# Patient Record
Sex: Female | Born: 1960 | Race: White | Hispanic: No | Marital: Married | Smoking: Former smoker
Health system: Southern US, Community
[De-identification: ages and names within clinical notes are randomized; demographics above are authoritative.]

## PROBLEM LIST (undated history)

## (undated) HISTORY — PX: PAROTIDECTOMY: SUR1003

---

## 2009-01-29 DIAGNOSIS — C801 Malignant (primary) neoplasm, unspecified: Secondary | ICD-10-CM

## 2009-01-29 HISTORY — DX: Malignant (primary) neoplasm, unspecified: C80.1

## 2009-11-29 ENCOUNTER — Ambulatory Visit: Payer: Self-pay | Admitting: Otolaryngology

## 2009-12-21 ENCOUNTER — Ambulatory Visit: Payer: Self-pay | Admitting: Otolaryngology

## 2009-12-29 ENCOUNTER — Ambulatory Visit: Payer: Self-pay | Admitting: Radiation Oncology

## 2010-01-06 ENCOUNTER — Ambulatory Visit: Payer: Self-pay | Admitting: Otolaryngology

## 2010-01-09 ENCOUNTER — Ambulatory Visit: Payer: Self-pay | Admitting: Radiation Oncology

## 2010-01-29 ENCOUNTER — Ambulatory Visit: Payer: Self-pay | Admitting: Radiation Oncology

## 2010-03-01 ENCOUNTER — Ambulatory Visit: Payer: Self-pay | Admitting: Radiation Oncology

## 2010-03-30 ENCOUNTER — Ambulatory Visit: Payer: Self-pay | Admitting: Radiation Oncology

## 2010-04-30 ENCOUNTER — Ambulatory Visit: Payer: Self-pay | Admitting: Radiation Oncology

## 2010-07-19 ENCOUNTER — Ambulatory Visit: Payer: Self-pay | Admitting: Oncology

## 2010-07-30 ENCOUNTER — Ambulatory Visit: Payer: Self-pay | Admitting: Oncology

## 2010-10-09 ENCOUNTER — Ambulatory Visit: Payer: Self-pay | Admitting: Oncology

## 2010-10-30 ENCOUNTER — Ambulatory Visit: Payer: Self-pay | Admitting: Oncology

## 2010-12-27 ENCOUNTER — Ambulatory Visit: Payer: Self-pay | Admitting: Oncology

## 2010-12-30 ENCOUNTER — Ambulatory Visit: Payer: Self-pay | Admitting: Oncology

## 2012-01-07 ENCOUNTER — Ambulatory Visit: Payer: Self-pay | Admitting: Radiation Oncology

## 2012-01-30 ENCOUNTER — Ambulatory Visit: Payer: Self-pay | Admitting: Radiation Oncology

## 2012-02-07 ENCOUNTER — Ambulatory Visit: Payer: Self-pay | Admitting: Otolaryngology

## 2012-07-07 ENCOUNTER — Ambulatory Visit: Payer: Self-pay | Admitting: Radiation Oncology

## 2012-07-29 ENCOUNTER — Ambulatory Visit: Payer: Self-pay | Admitting: Radiation Oncology

## 2019-02-09 ENCOUNTER — Other Ambulatory Visit: Payer: Self-pay | Admitting: Orthopedic Surgery

## 2019-02-09 DIAGNOSIS — M4722 Other spondylosis with radiculopathy, cervical region: Secondary | ICD-10-CM

## 2019-02-15 ENCOUNTER — Ambulatory Visit
Admission: RE | Admit: 2019-02-15 | Discharge: 2019-02-15 | Disposition: A | Discharge status: Home / Self Care | Payer: BC Managed Care – PPO | Source: Ambulatory Visit | Attending: Orthopedic Surgery | Admitting: Orthopedic Surgery

## 2019-02-15 ENCOUNTER — Other Ambulatory Visit: Payer: Self-pay

## 2019-02-15 DIAGNOSIS — M4722 Other spondylosis with radiculopathy, cervical region: Secondary | ICD-10-CM

## 2019-02-16 ENCOUNTER — Ambulatory Visit: Admission: RE | Admit: 2019-02-16 | Payer: Self-pay | Source: Ambulatory Visit

## 2019-03-03 ENCOUNTER — Other Ambulatory Visit: Payer: Self-pay | Admitting: Neurosurgery

## 2019-03-27 ENCOUNTER — Other Ambulatory Visit: Payer: Self-pay

## 2019-03-27 ENCOUNTER — Encounter
Admission: RE | Admit: 2019-03-27 | Discharge: 2019-03-27 | Disposition: A | Discharge status: Home / Self Care | Payer: BC Managed Care – PPO | Source: Ambulatory Visit | Attending: Neurosurgery | Admitting: Neurosurgery

## 2019-03-27 NOTE — Patient Instructions (Signed)
Your COVID test is scheduled on:  Monday 04/06/19.  Drive up in front of the [facility] any time 8:00-10:30 am.  If you are required to get additional lab work, you may be required to enter through the [facility].  Your procedure is scheduled on: Wednesday 04/08/19 Report to Same Day Surgery 2nd floor [facility] Entrance-take elevator on left to 2nd floor.  Check in with surgery information desk.) To find out your arrival time, call [phone] 1:00-3:00 PM on Tuesday 04/07/19  Remember: Instructions that are not followed completely may result in serious medical risk, up to and including death, or upon the discretion of your surgeon and anesthesiologist your surgery may need to be rescheduled.   __x__ 1. Do not eat food (including mints, candies, chewing gum) after midnight the night before your procedure. You may drink clear liquids up to 2 hours before you are scheduled to arrive at the hospital for your procedure.  Do not drink anything within 2 hours of your scheduled arrival to the hospital.  Approved clear liquids:  --Water or Apple juice without pulp  --Clear carbohydrate beverage such as Gatorade or Powerade  --Black Coffee or Clear Tea (No milk, no creamers, do not add anything to the coffee or tea)   __x__ 2. No alcohol, smoking or e-cigarettes for 24 hours before or after surgery.  __x__ 3. Notify your doctor if there is any change in your medical condition (cold, fever, infections).  __x__ 4. On the morning of surgery brush your teeth with toothpaste and water.  You may rinse your mouth with mouthwash if you wish.  Do not swallow any toothpaste or mouthwash.  Please read over the following fact sheets that you were given: [facility] Preparing for Surgery, MRSA Information, Spine Surgery Guide, Advance Directives  __x__ Use CHG Soap as directed on instruction sheet.  Do not wear jewelry, make-up, hairpins, clips or nail polish on the day of surgery. Do  not wear lotions, powders, deodorant, or perfumes.  Do not shave below the face/neck 48 hours prior to surgery.  Do not bring valuables to the hospital.  [facility] is not responsible for any belongings or valuables.   Dentures or bridgework may not be worn into surgery. For patients admitted to the hospital, discharge time is determined by your treatment team.  __x__ Take these medicines on the morning of surgery with a SMALL SIP OF WATER:  1. Gabapentin (Neurontin)  __x__ Follow recommendations from Cardiologist, Pulmonologist or PCP regarding stopping Aspirin, Coumadin, Plavix, Eliquis, Effient, Pradaxa, and Pletal, if you are on any blood thinners.  __x__ Do not take any Anti-inflammatories such as Advil, Ibuprofen, Motrin, Aleve, Naproxen, Naprosyn, BC/Goodies powders or aspirin-containing products. You may take Tylenol if needed.   __x__ Do not take any over the counter vitamins/ supplements until after surgery.

## 2019-04-06 ENCOUNTER — Other Ambulatory Visit: Payer: Self-pay

## 2019-04-06 ENCOUNTER — Encounter
Admission: RE | Admit: 2019-04-06 | Discharge: 2019-04-06 | Disposition: A | Discharge status: Home / Self Care | Payer: BC Managed Care – PPO | Source: Ambulatory Visit | Attending: Neurosurgery | Admitting: Neurosurgery

## 2019-04-06 ENCOUNTER — Other Ambulatory Visit: Payer: BC Managed Care – PPO

## 2019-04-06 DIAGNOSIS — Z20822 Contact with and (suspected) exposure to covid-19: Secondary | ICD-10-CM | POA: Insufficient documentation

## 2019-04-06 DIAGNOSIS — Z01812 Encounter for preprocedural laboratory examination: Secondary | ICD-10-CM | POA: Insufficient documentation

## 2019-04-06 LAB — BASIC METABOLIC PANEL
Anion gap: 8 (ref 5–15)
BUN: 13 mg/dL (ref 6–20)
CO2: 25 mmol/L (ref 22–32)
Calcium: 9.3 mg/dL (ref 8.9–10.3)
Chloride: 108 mmol/L (ref 98–111)
Creatinine, Ser: 0.61 mg/dL (ref 0.44–1.00)
GFR calc Af Amer: >60 mL/min (ref >60)
GFR calc non Af Amer: >60 mL/min (ref >60)
Glucose, Bld: 103 mg/dL — ABNORMAL HIGH (ref 70–99)
Potassium: 3.8 mmol/L (ref 3.5–5.1)
Sodium: 141 mmol/L (ref 135–145)

## 2019-04-06 LAB — URINALYSIS, ROUTINE W REFLEX MICROSCOPIC
Bilirubin Urine: NEGATIVE
Glucose, UA: NEGATIVE mg/dL
Hgb urine dipstick: NEGATIVE
Ketones, ur: NEGATIVE mg/dL
Leukocytes,Ua: NEGATIVE
Nitrite: NEGATIVE
Protein, ur: NEGATIVE mg/dL
Specific Gravity, Urine: 1.01 (ref 1.005–1.030)
pH: 5 (ref 5.0–8.0)

## 2019-04-06 LAB — SURGICAL PCR SCREEN
MRSA, PCR: NEGATIVE
Staphylococcus aureus: POSITIVE — AB

## 2019-04-06 LAB — SARS CORONAVIRUS 2 (TAT 6-24 HRS): SARS Coronavirus 2: NEGATIVE

## 2019-04-06 LAB — PROTIME-INR
INR: 0.9 (ref 0.8–1.2)
Prothrombin Time: 12.5 seconds (ref 11.4–15.2)

## 2019-04-06 LAB — TYPE AND SCREEN
ABO/RH(D): A POS
Antibody Screen: NEGATIVE

## 2019-04-06 LAB — CBC
HCT: 40.5 % (ref 36.0–46.0)
Hemoglobin: 13.1 g/dL (ref 12.0–15.0)
MCH: 27.7 pg (ref 26.0–34.0)
MCHC: 32.3 g/dL (ref 30.0–36.0)
MCV: 85.6 fL (ref 80.0–100.0)
Platelets: 272 10*3/uL (ref 150–400)
RBC: 4.73 MIL/uL (ref 3.87–5.11)
RDW: 12.7 % (ref 11.5–15.5)
WBC: 4.7 10*3/uL (ref 4.0–10.5)
nRBC: 0 % (ref 0.0–0.2)

## 2019-04-06 LAB — APTT: aPTT: 29 seconds (ref 24–36)

## 2019-04-08 ENCOUNTER — Inpatient Hospital Stay
Admission: RE | Admit: 2019-04-08 | Discharge: 2019-04-09 | DRG: 473 | Disposition: A | Discharge status: Home / Self Care | Payer: BC Managed Care – PPO | Attending: Neurosurgery | Admitting: Neurosurgery

## 2019-04-08 ENCOUNTER — Inpatient Hospital Stay: Payer: BC Managed Care – PPO | Admitting: Registered Nurse

## 2019-04-08 ENCOUNTER — Encounter: Payer: Self-pay | Admitting: Neurosurgery

## 2019-04-08 ENCOUNTER — Other Ambulatory Visit: Payer: Self-pay

## 2019-04-08 ENCOUNTER — Encounter
Admission: RE | Disposition: A | Discharge status: Home / Self Care | Payer: Self-pay | Source: Home / Self Care | Attending: Neurosurgery

## 2019-04-08 ENCOUNTER — Inpatient Hospital Stay: Payer: BC Managed Care – PPO

## 2019-04-08 DIAGNOSIS — Z79899 Other long term (current) drug therapy: Secondary | ICD-10-CM

## 2019-04-08 DIAGNOSIS — F17290 Nicotine dependence, other tobacco product, uncomplicated: Secondary | ICD-10-CM | POA: Diagnosis present

## 2019-04-08 DIAGNOSIS — M2578 Osteophyte, vertebrae: Secondary | ICD-10-CM | POA: Diagnosis present

## 2019-04-08 DIAGNOSIS — E669 Obesity, unspecified: Secondary | ICD-10-CM | POA: Diagnosis present

## 2019-04-08 DIAGNOSIS — Z886 Allergy status to analgesic agent status: Secondary | ICD-10-CM | POA: Diagnosis not present

## 2019-04-08 DIAGNOSIS — Z20822 Contact with and (suspected) exposure to covid-19: Secondary | ICD-10-CM | POA: Diagnosis present

## 2019-04-08 DIAGNOSIS — M4712 Other spondylosis with myelopathy, cervical region: Secondary | ICD-10-CM | POA: Diagnosis present

## 2019-04-08 DIAGNOSIS — Z419 Encounter for procedure for purposes other than remedying health state, unspecified: Secondary | ICD-10-CM

## 2019-04-08 DIAGNOSIS — Z888 Allergy status to other drugs, medicaments and biological substances status: Secondary | ICD-10-CM

## 2019-04-08 DIAGNOSIS — M4802 Spinal stenosis, cervical region: Secondary | ICD-10-CM | POA: Diagnosis present

## 2019-04-08 DIAGNOSIS — Z981 Arthrodesis status: Secondary | ICD-10-CM

## 2019-04-08 DIAGNOSIS — Z6830 Body mass index (BMI) 30.0-30.9, adult: Secondary | ICD-10-CM | POA: Diagnosis not present

## 2019-04-08 DIAGNOSIS — G959 Disease of spinal cord, unspecified: Secondary | ICD-10-CM | POA: Diagnosis present

## 2019-04-08 HISTORY — PX: ANTERIOR CERVICAL DECOMP/DISCECTOMY FUSION: SHX1161

## 2019-04-08 LAB — ABO/RH: ABO/RH(D): A POS

## 2019-04-08 SURGERY — ANTERIOR CERVICAL DECOMPRESSION/DISCECTOMY FUSION 3 LEVELS
Anesthesia: General

## 2019-04-08 MED ORDER — PROPOFOL 10 MG/ML IV BOLUS
INTRAVENOUS | Status: AC
  Filled 2019-04-08: qty 20

## 2019-04-08 MED ORDER — KETAMINE HCL 10 MG/ML IJ SOLN
INTRAMUSCULAR | Status: DC | PRN
  Administered 2019-04-08 (×2): 20 mg via INTRAVENOUS
  Administered 2019-04-08: 10 mg via INTRAVENOUS

## 2019-04-08 MED ORDER — DEXAMETHASONE SODIUM PHOSPHATE 10 MG/ML IJ SOLN
INTRAMUSCULAR | Status: DC | PRN
  Administered 2019-04-08: 10 mg via INTRAVENOUS

## 2019-04-08 MED ORDER — LATANOPROST 0.005 % OP SOLN
1.0000 [drp] | OPHTHALMIC | Status: DC
  Filled 2019-04-08: qty 2.5

## 2019-04-08 MED ORDER — BACITRACIN 50000 UNITS IM SOLR
INTRAMUSCULAR | Status: AC
  Filled 2019-04-08: qty 1

## 2019-04-08 MED ORDER — DEXAMETHASONE SODIUM PHOSPHATE 10 MG/ML IJ SOLN
8.0000 mg | Freq: Four times a day (QID) | INTRAMUSCULAR | Status: AC
  Administered 2019-04-08 – 2019-04-09 (×3): 8 mg via INTRAVENOUS
  Filled 2019-04-08 (×3): qty 0.8

## 2019-04-08 MED ORDER — DEXAMETHASONE SODIUM PHOSPHATE 10 MG/ML IJ SOLN
INTRAMUSCULAR | Status: AC
  Filled 2019-04-08: qty 1

## 2019-04-08 MED ORDER — ACETAMINOPHEN 650 MG RE SUPP: 650.0000 mg | RECTAL | Status: DC | PRN

## 2019-04-08 MED ORDER — GLYCOPYRROLATE 0.2 MG/ML IJ SOLN
INTRAMUSCULAR | Status: DC | PRN
  Administered 2019-04-08: .2 mg via INTRAVENOUS

## 2019-04-08 MED ORDER — REMIFENTANIL HCL 1 MG IV SOLR
INTRAVENOUS | Status: AC
  Filled 2019-04-08: qty 1000

## 2019-04-08 MED ORDER — POLYETHYLENE GLYCOL 3350 17 G PO PACK: 17.0000 g | PACK | Freq: Every day | ORAL | Status: DC | PRN

## 2019-04-08 MED ORDER — OXYCODONE HCL 5 MG PO TABS: 5.0000 mg | ORAL_TABLET | Freq: Once | ORAL | Status: DC | PRN

## 2019-04-08 MED ORDER — METHOCARBAMOL 500 MG PO TABS
500.0000 mg | ORAL_TABLET | Freq: Four times a day (QID) | ORAL | Status: DC
  Administered 2019-04-08 – 2019-04-09 (×3): 500 mg via ORAL
  Filled 2019-04-08 (×3): qty 1

## 2019-04-08 MED ORDER — SODIUM CHLORIDE 0.9% FLUSH
3.0000 mL | Freq: Two times a day (BID) | INTRAVENOUS | Status: DC
  Administered 2019-04-08 – 2019-04-09 (×3): 3 mL via INTRAVENOUS

## 2019-04-08 MED ORDER — CEFAZOLIN SODIUM-DEXTROSE 2-4 GM/100ML-% IV SOLN
2.0000 g | Freq: Once | INTRAVENOUS | Status: AC
  Administered 2019-04-08: 2 g via INTRAVENOUS

## 2019-04-08 MED ORDER — FENTANYL CITRATE (PF) 100 MCG/2ML IJ SOLN
INTRAMUSCULAR | Status: AC
  Administered 2019-04-08: 25 ug via INTRAVENOUS
  Filled 2019-04-08: qty 2

## 2019-04-08 MED ORDER — GABAPENTIN 300 MG PO CAPS
300.0000 mg | ORAL_CAPSULE | Freq: Three times a day (TID) | ORAL | Status: DC
  Administered 2019-04-08 – 2019-04-09 (×3): 300 mg via ORAL
  Filled 2019-04-08 (×3): qty 1

## 2019-04-08 MED ORDER — DEXMEDETOMIDINE HCL 200 MCG/2ML IV SOLN
INTRAVENOUS | Status: DC | PRN
  Administered 2019-04-08 (×2): 8 ug via INTRAVENOUS
  Administered 2019-04-08: 4 ug via INTRAVENOUS
  Administered 2019-04-08: 8 ug via INTRAVENOUS

## 2019-04-08 MED ORDER — SODIUM CHLORIDE 0.9% FLUSH: 3.0000 mL | INTRAVENOUS | Status: DC | PRN

## 2019-04-08 MED ORDER — EPHEDRINE 5 MG/ML INJ
INTRAVENOUS | Status: AC
  Filled 2019-04-08: qty 30

## 2019-04-08 MED ORDER — SODIUM CHLORIDE 0.9 % IV SOLN: INTRAVENOUS | Status: DC

## 2019-04-08 MED ORDER — SODIUM CHLORIDE 0.9 % IR SOLN
Status: DC | PRN
  Administered 2019-04-08: 1000 mL

## 2019-04-08 MED ORDER — SUCCINYLCHOLINE CHLORIDE 20 MG/ML IJ SOLN
INTRAMUSCULAR | Status: DC | PRN
  Administered 2019-04-08: 80 mg via INTRAVENOUS

## 2019-04-08 MED ORDER — LIDOCAINE HCL (PF) 2 % IJ SOLN
INTRAMUSCULAR | Status: AC
  Filled 2019-04-08: qty 10

## 2019-04-08 MED ORDER — BUPIVACAINE HCL (PF) 0.5 % IJ SOLN
INTRAMUSCULAR | Status: AC
  Filled 2019-04-08: qty 30

## 2019-04-08 MED ORDER — LIDOCAINE HCL (CARDIAC) PF 100 MG/5ML IV SOSY
PREFILLED_SYRINGE | INTRAVENOUS | Status: DC | PRN
  Administered 2019-04-08: 80 mg via INTRAVENOUS

## 2019-04-08 MED ORDER — MENTHOL 3 MG MT LOZG
1.0000 | LOZENGE | OROMUCOSAL | Status: DC | PRN
  Filled 2019-04-08: qty 9

## 2019-04-08 MED ORDER — SODIUM CHLORIDE 0.9% FLUSH
INTRAVENOUS | Status: DC | PRN
  Administered 2019-04-08: 10 mL

## 2019-04-08 MED ORDER — KETAMINE HCL 50 MG/ML IJ SOLN
INTRAMUSCULAR | Status: AC
  Filled 2019-04-08: qty 10

## 2019-04-08 MED ORDER — METHOCARBAMOL 1000 MG/10ML IJ SOLN
500.0000 mg | Freq: Four times a day (QID) | INTRAVENOUS | Status: DC
  Administered 2019-04-08: 500 mg via INTRAVENOUS
  Filled 2019-04-08 (×7): qty 5

## 2019-04-08 MED ORDER — GLYCOPYRROLATE 0.2 MG/ML IJ SOLN
INTRAMUSCULAR | Status: AC
  Filled 2019-04-08: qty 2

## 2019-04-08 MED ORDER — ACETAMINOPHEN 500 MG PO TABS
1000.0000 mg | ORAL_TABLET | Freq: Four times a day (QID) | ORAL | Status: DC
  Administered 2019-04-08 – 2019-04-09 (×3): 1000 mg via ORAL
  Filled 2019-04-08 (×3): qty 2

## 2019-04-08 MED ORDER — LACTATED RINGERS IV SOLN: INTRAVENOUS | Status: DC | PRN

## 2019-04-08 MED ORDER — ONDANSETRON HCL 4 MG PO TABS: 4.0000 mg | ORAL_TABLET | Freq: Four times a day (QID) | ORAL | Status: DC | PRN

## 2019-04-08 MED ORDER — FENTANYL CITRATE (PF) 100 MCG/2ML IJ SOLN
INTRAMUSCULAR | Status: AC
  Filled 2019-04-08: qty 2

## 2019-04-08 MED ORDER — LACTATED RINGERS IV SOLN: INTRAVENOUS | Status: DC

## 2019-04-08 MED ORDER — PROMETHAZINE HCL 25 MG/ML IJ SOLN: 6.2500 mg | INTRAMUSCULAR | Status: DC | PRN

## 2019-04-08 MED ORDER — OXYCODONE HCL 5 MG PO TABS
5.0000 mg | ORAL_TABLET | ORAL | Status: DC | PRN
  Administered 2019-04-09: 5 mg via ORAL
  Filled 2019-04-08: qty 1

## 2019-04-08 MED ORDER — SUCCINYLCHOLINE CHLORIDE 200 MG/10ML IV SOSY
PREFILLED_SYRINGE | INTRAVENOUS | Status: AC
  Filled 2019-04-08: qty 20

## 2019-04-08 MED ORDER — SODIUM CHLORIDE (PF) 0.9 % IJ SOLN
INTRAMUSCULAR | Status: AC
  Filled 2019-04-08: qty 10

## 2019-04-08 MED ORDER — MIDAZOLAM HCL 2 MG/2ML IJ SOLN
INTRAMUSCULAR | Status: AC
  Filled 2019-04-08: qty 2

## 2019-04-08 MED ORDER — FENTANYL CITRATE (PF) 100 MCG/2ML IJ SOLN
INTRAMUSCULAR | Status: DC | PRN
  Administered 2019-04-08 (×4): 50 ug via INTRAVENOUS

## 2019-04-08 MED ORDER — FAMOTIDINE 20 MG PO TABS: 20.0000 mg | ORAL_TABLET | Freq: Once | ORAL | Status: AC

## 2019-04-08 MED ORDER — FENTANYL CITRATE (PF) 100 MCG/2ML IJ SOLN
25.0000 ug | INTRAMUSCULAR | Status: DC | PRN
  Administered 2019-04-08: 50 ug via INTRAVENOUS
  Administered 2019-04-08 (×2): 25 ug via INTRAVENOUS

## 2019-04-08 MED ORDER — PHENYLEPHRINE HCL (PRESSORS) 10 MG/ML IV SOLN
INTRAVENOUS | Status: DC | PRN
  Administered 2019-04-08: 100 ug via INTRAVENOUS

## 2019-04-08 MED ORDER — MAGNESIUM CITRATE PO SOLN
1.0000 | Freq: Once | ORAL | Status: DC | PRN
  Filled 2019-04-08: qty 296

## 2019-04-08 MED ORDER — ONDANSETRON HCL 4 MG/2ML IJ SOLN
INTRAMUSCULAR | Status: AC
  Filled 2019-04-08: qty 2

## 2019-04-08 MED ORDER — CEFAZOLIN SODIUM-DEXTROSE 2-4 GM/100ML-% IV SOLN
INTRAVENOUS | Status: AC
  Filled 2019-04-08: qty 100

## 2019-04-08 MED ORDER — BISACODYL 5 MG PO TBEC: 5.0000 mg | DELAYED_RELEASE_TABLET | Freq: Every day | ORAL | Status: DC | PRN

## 2019-04-08 MED ORDER — HYDROMORPHONE HCL 1 MG/ML IJ SOLN: 0.5000 mg | INTRAMUSCULAR | Status: DC | PRN

## 2019-04-08 MED ORDER — BIMATOPROST 0.03 % EX SOLN: 1.0000 "application " | CUTANEOUS | Status: DC

## 2019-04-08 MED ORDER — SODIUM CHLORIDE 0.9 % IV SOLN: 250.0000 mL | INTRAVENOUS | Status: DC

## 2019-04-08 MED ORDER — PROPOFOL 500 MG/50ML IV EMUL
INTRAVENOUS | Status: AC
  Filled 2019-04-08: qty 50

## 2019-04-08 MED ORDER — SENNA 8.6 MG PO TABS
1.0000 | ORAL_TABLET | Freq: Two times a day (BID) | ORAL | Status: DC
  Administered 2019-04-08 – 2019-04-09 (×2): 8.6 mg via ORAL
  Filled 2019-04-08 (×2): qty 1

## 2019-04-08 MED ORDER — PHENYLEPHRINE HCL-NACL 20-0.9 MG/250ML-% IV SOLN
INTRAVENOUS | Status: DC | PRN
  Administered 2019-04-08: 15 ug/min via INTRAVENOUS

## 2019-04-08 MED ORDER — PHENOL 1.4 % MT LIQD
1.0000 | OROMUCOSAL | Status: DC | PRN
  Filled 2019-04-08: qty 177

## 2019-04-08 MED ORDER — MEPERIDINE HCL 50 MG/ML IJ SOLN: 6.2500 mg | INTRAMUSCULAR | Status: DC | PRN

## 2019-04-08 MED ORDER — THROMBIN 5000 UNITS EX SOLR
CUTANEOUS | Status: AC
  Filled 2019-04-08: qty 5000

## 2019-04-08 MED ORDER — ONDANSETRON HCL 4 MG/2ML IJ SOLN
4.0000 mg | Freq: Four times a day (QID) | INTRAMUSCULAR | Status: DC | PRN
  Administered 2019-04-08: 4 mg via INTRAVENOUS
  Filled 2019-04-08: qty 2

## 2019-04-08 MED ORDER — BUPIVACAINE-EPINEPHRINE 0.5% -1:200000 IJ SOLN
INTRAMUSCULAR | Status: DC | PRN
  Administered 2019-04-08: 10 mL

## 2019-04-08 MED ORDER — OXYCODONE HCL 5 MG PO TABS
10.0000 mg | ORAL_TABLET | ORAL | Status: DC | PRN
  Administered 2019-04-08 – 2019-04-09 (×3): 10 mg via ORAL
  Filled 2019-04-08 (×3): qty 2

## 2019-04-08 MED ORDER — SODIUM CHLORIDE FLUSH 0.9 % IV SOLN
INTRAVENOUS | Status: AC
  Filled 2019-04-08: qty 10

## 2019-04-08 MED ORDER — GELATIN ABSORBABLE 12-7 MM EX MISC
CUTANEOUS | Status: AC
  Filled 2019-04-08: qty 1

## 2019-04-08 MED ORDER — EPINEPHRINE PF 1 MG/ML IJ SOLN
INTRAMUSCULAR | Status: AC
  Filled 2019-04-08: qty 1

## 2019-04-08 MED ORDER — PROPOFOL 10 MG/ML IV BOLUS
INTRAVENOUS | Status: DC | PRN
  Administered 2019-04-08: 20 mg via INTRAVENOUS
  Administered 2019-04-08: 50 mg via INTRAVENOUS
  Administered 2019-04-08: 140 mg via INTRAVENOUS

## 2019-04-08 MED ORDER — ONDANSETRON HCL 4 MG/2ML IJ SOLN
INTRAMUSCULAR | Status: DC | PRN
  Administered 2019-04-08: 4 mg via INTRAVENOUS

## 2019-04-08 MED ORDER — PROPOFOL 500 MG/50ML IV EMUL
INTRAVENOUS | Status: DC | PRN
  Administered 2019-04-08: 125 ug/kg/min via INTRAVENOUS

## 2019-04-08 MED ORDER — ACETAMINOPHEN 325 MG PO TABS
650.0000 mg | ORAL_TABLET | ORAL | Status: DC | PRN
  Administered 2019-04-09: 650 mg via ORAL
  Filled 2019-04-08: qty 2

## 2019-04-08 MED ORDER — FAMOTIDINE 20 MG PO TABS
ORAL_TABLET | ORAL | Status: AC
  Administered 2019-04-08: 20 mg via ORAL
  Filled 2019-04-08: qty 1

## 2019-04-08 MED ORDER — MIDAZOLAM HCL 2 MG/2ML IJ SOLN
INTRAMUSCULAR | Status: DC | PRN
  Administered 2019-04-08: 2 mg via INTRAVENOUS

## 2019-04-08 MED ORDER — REMIFENTANIL HCL 1 MG IV SOLR
INTRAVENOUS | Status: DC | PRN
  Administered 2019-04-08: .15 ug/kg/min via INTRAVENOUS

## 2019-04-08 MED ORDER — THROMBIN 5000 UNITS EX SOLR
CUTANEOUS | Status: DC | PRN
  Administered 2019-04-08: 5000 [IU] via TOPICAL

## 2019-04-08 MED ORDER — OXYCODONE HCL 5 MG/5ML PO SOLN: 5.0000 mg | Freq: Once | ORAL | Status: DC | PRN

## 2019-04-08 SURGICAL SUPPLY — 67 items
BAND RUBBER 3X1/6 STRL (MISCELLANEOUS) ×1 IMPLANT
BAND RUBBER 3X1/6 TAN STRL (MISCELLANEOUS) IMPLANT
BLADE BOVIE TIP EXT 4 (BLADE) ×3 IMPLANT
BLADE SURG 15 STRL LF DISP TIS (BLADE) ×1 IMPLANT
BLADE SURG 15 STRL SS (BLADE) ×2
BULB RESERV EVAC DRAIN JP 100C (MISCELLANEOUS) IMPLANT
BUR NEURO DRILL SOFT 3.0X3.8M (BURR) ×3 IMPLANT
CANISTER SUCT 1200ML W/VALVE (MISCELLANEOUS) ×6 IMPLANT
CHLORAPREP W/TINT 26 (MISCELLANEOUS) ×3 IMPLANT
CLOSURE WOUND 1/2 X4 (GAUZE/BANDAGES/DRESSINGS)
COUNTER NEEDLE 20/40 LG (NEEDLE) ×3 IMPLANT
COVER BACK TABLE REUSABLE LG (DRAPES) ×3 IMPLANT
COVER LIGHT HANDLE STERIS (MISCELLANEOUS) ×6 IMPLANT
COVER WAND RF STERILE (DRAPES) ×3 IMPLANT
CRADLE LAMINECT ARM (MISCELLANEOUS) ×3 IMPLANT
CUP MEDICINE 2OZ PLAST GRAD ST (MISCELLANEOUS) ×6 IMPLANT
DERMABOND ADVANCED (GAUZE/BANDAGES/DRESSINGS) ×2
DERMABOND ADVANCED .7 DNX12 (GAUZE/BANDAGES/DRESSINGS) ×1 IMPLANT
DRAIN CHANNEL JP 10F RND 20C F (MISCELLANEOUS) IMPLANT
DRAPE 3/4 80X56 (DRAPES) ×3 IMPLANT
DRAPE C-ARM 42X72 X-RAY (DRAPES) ×6 IMPLANT
DRAPE INCISE IOBAN 66X45 STRL (DRAPES) ×3 IMPLANT
DRAPE LAPAROTOMY 77X122 PED (DRAPES) ×3 IMPLANT
DRAPE MICROSCOPE SPINE 48X150 (DRAPES) ×3 IMPLANT
DRAPE POUCH INSTRU U-SHP 10X18 (DRAPES) ×3 IMPLANT
DRAPE SURG 17X11 SM STRL (DRAPES) ×12 IMPLANT
ELECT CAUTERY BLADE TIP 2.5 (TIP) ×3
ELECTRODE CAUTERY BLDE TIP 2.5 (TIP) ×1 IMPLANT
FEE INTRAOP MONITOR IMPULS NCS (MISCELLANEOUS) IMPLANT
FRAME EYE SHIELD (PROTECTIVE WEAR) ×3 IMPLANT
GAUZE SPONGE 4X4 12PLY STRL (GAUZE/BANDAGES/DRESSINGS) ×3 IMPLANT
GLOVE SURG SYN 7.0 (GLOVE) ×6 IMPLANT
GLOVE SURG SYN 7.0 PF PI (GLOVE) ×2 IMPLANT
GLOVE SURG SYN 8.5  E (GLOVE) ×6
GLOVE SURG SYN 8.5 E (GLOVE) ×3 IMPLANT
GLOVE SURG SYN 8.5 PF PI (GLOVE) ×3 IMPLANT
GOWN SRG XL LVL 3 NONREINFORCE (GOWNS) ×1 IMPLANT
GOWN STRL NON-REIN TWL XL LVL3 (GOWNS) ×2
GOWN STRL REUS W/ TWL LRG LVL3 (GOWN DISPOSABLE) ×1 IMPLANT
GOWN STRL REUS W/TWL LRG LVL3 (GOWN DISPOSABLE) ×2
GRADUATE 1200CC STRL 31836 (MISCELLANEOUS) ×3 IMPLANT
INTRAOP MONITOR FEE IMPULS NCS (MISCELLANEOUS) ×1
INTRAOP MONITOR FEE IMPULSE (MISCELLANEOUS) ×2
IV CATH ANGIO 12GX3 LT BLUE (NEEDLE) ×3 IMPLANT
KIT TURNOVER KIT A (KITS) ×3 IMPLANT
MARKER SKIN DUAL TIP RULER LAB (MISCELLANEOUS) ×6 IMPLANT
NEEDLE HYPO 22GX1.5 SAFETY (NEEDLE) ×3 IMPLANT
NS IRRIG 1000ML POUR BTL (IV SOLUTION) ×3 IMPLANT
PACK LAMINECTOMY NEURO (CUSTOM PROCEDURE TRAY) ×3 IMPLANT
PIN CASPAR 14 (PIN) ×1 IMPLANT
PIN CASPAR 14MM (PIN) ×3
PLATE ANT CERV XTEND 3 LV 51 (Plate) ×2 IMPLANT
SCREW VAR 4.2 XD SELF DRILL 16 (Screw) ×16 IMPLANT
SPACER CERVICAL FRGE 12X14X7-7 (Spacer) ×6 IMPLANT
SPOGE SURGIFLO 8M (HEMOSTASIS) ×2
SPONGE KITTNER 5P (MISCELLANEOUS) ×6 IMPLANT
SPONGE SURGIFLO 8M (HEMOSTASIS) ×1 IMPLANT
STAPLER SKIN PROX 35W (STAPLE) IMPLANT
STRIP CLOSURE SKIN 1/2X4 (GAUZE/BANDAGES/DRESSINGS) IMPLANT
SUT V-LOC 90 ABS DVC 3-0 CL (SUTURE) ×3 IMPLANT
SUT VIC AB 3-0 SH 8-18 (SUTURE) ×3 IMPLANT
SYR 30ML LL (SYRINGE) ×3 IMPLANT
TAPE CLOTH 3X10 WHT NS LF (GAUZE/BANDAGES/DRESSINGS) ×5 IMPLANT
TOWEL OR 17X26 4PK STRL BLUE (TOWEL DISPOSABLE) ×12 IMPLANT
TRAY FOLEY MTR SLVR 16FR STAT (SET/KITS/TRAYS/PACK) IMPLANT
TUBING CONNECTING 10 (TUBING) ×2 IMPLANT
TUBING CONNECTING 10' (TUBING) ×1

## 2019-04-08 NOTE — H&P (Signed)
History of Present Illness: 04/08/2019 Debra May presents today with continued symptoms and would like to move forward with surgery.  02/24/2019 Debra May is here today with a chief complaint of bilateral arm pain (right worse than left), tingling in bilateral thumb, index, and middle fingers. She reports issues with dropping items and feelings of off balance at times.  She began having symptoms in January 2020 got, but has had progressive symptoms over the past several months where she has problems with holding items, weakness in her hands, dexterity changes including changes in her handwriting, as well as a subjective sense of imbalance. She is almost fallen in the shower due to imbalance. She is also had multiple episodes where she nearly caught her foot. She reports burning and pins-and-needles feeling in her hands that has been very difficult for her. She has trouble feeling small objects. She has started gabapentin, but it did not eliminate the issue. She also has hypersensitivity and pain into her forearms bilaterally. The worst discomfort in her hands and the first second and third fingers.  She reports significant weakness where she now has asked her husband to help her with things she previously did not.  Bowel/Bladder Dysfunction: none  Conservative measures:  Physical therapy: has participated at [place] a month ago with some relief (in regards to range of motion, but no relief from pain) Multimodal medical therapy including regular antiinflammatories: gabapentin, ibuprofen, meloxicam, tylenol, goody powder Injections: has not tried epidural steroid injections  Past Surgery: denies  Debra May has clear and progressive symptoms of cervical myelopathy.  The symptoms are causing a significant impact on the patient's life.   Review of Systems:  A 10 point review of systems is negative, except for the pertinent positives and negatives detailed in the HPI.  Past Medical  History: No past medical history on file.  Past Surgical History: No past surgical history on file.  Allergies  Allergen Reactions  . Naproxen Sodium Itching  . Meloxicam Itching and Rash   Current Meds  Medication Sig  . bimatoprost (LATISSE) 0.03 % ophthalmic solution Place 1 application into both eyes every other day. Place one drop on applicator and apply evenly along the skin of the upper eyelid at base of eyelashes once daily at bedtime; repeat procedure for second eye (use a clean applicator).  . gabapentin (NEURONTIN) 300 MG capsule Take 300 mg by mouth 3 (three) times daily.  Marland Kitchen ibuprofen (ADVIL) 200 MG tablet Take 400 mg by mouth every 6 (six) hours as needed for moderate pain.    Social History: Social History   Tobacco Use  . Smoking status: Not on file  Substance Use Topics  . Alcohol use: Not on file  . Drug use: Not on file   Family Medical History: No family history on file.  Physical Examination:  Vitals:   04/08/19 0913  BP: 125/73  Pulse: 72  Resp: 16  Temp: 98 F (36.7 C)  SpO2: 96%     General: Patient is well developed, well nourished, calm, collected, and in no apparent distress. Attention to examination is appropriate.  Psychiatric: Patient is non-anxious.  Head: Pupils equal, round, and reactive to light.  ENT: Oral mucosa appears well hydrated.  Neck: Supple. Full range of motion.  Respiratory: Patient is breathing without any difficulty.  Extremities: No edema.  Vascular: Palpable dorsal pedal pulses.  Skin: On exposed skin, there are no abnormal skin lesions.  Heart sounds normal no MRG. Chest Clear to  Auscultation Bilaterally.   NEUROLOGICAL:   Awake, alert, oriented to person, place, and time. Speech is clear and fluent. Fund of knowledge is appropriate.   Cranial Nerves: Pupils equal round and reactive to light. Facial tone is symmetric. Facial sensation is symmetric. Shoulder shrug is symmetric. Tongue protrusion is  midline. There is no pronator drift.  ROM of spine: full. Palpation of spine: non tender.   Strength: Side Biceps Triceps Deltoid Interossei Grip Wrist Ext. Wrist Flex.  R 5 4 4  4+ 5 5 5   L 5 5 5  4+ 5 5 5    Side Iliopsoas Quads Hamstring PF DF EHL  R 5 5 5 5 5 5   L 5 5 5 5 5 5    Reflexes are 1+ and symmetric at the biceps, triceps, brachioradialis, patella and achilles. Hoffman's is absent. Clonus is not present. Toes are down-going.  Bilateral upper and lower extremity sensation is intact to light touch.  Gait is wide-based. Moderate difficulty with tandem gait.  No evidence of dysmetria noted.  Medical Decision Making  Imaging: MRI C spine 02/15/2019  C2-C3: Right greater than left facet hypertrophy. Otherwise unremarkable. No stenosis.  C3-C4: Small left paracentral disc osteophyte complex indents the left ventral thecal sac (series 8, image 7). Secondary minimal flattening of the ventral left hemicord. Moderate facet and ligament flavum hypertrophy. Resultant mild spinal stenosis. Moderate bilateral C4 foraminal narrowing.  C4-C5: Diffuse degenerative disc osteophyte with intervertebral disc space narrowing. Severe facet and ligament flavum hypertrophy. Resultant severe spinal stenosis with the thecal sac measuring 6 mm in AP diameter. Patchy cord signal abnormality compatible with chronic myelomalacia. Severe bilateral C5 foraminal narrowing.  C5-C6: Diffuse disc bulge with bilateral uncovertebral hypertrophy, asymmetric to the left. Flattening and partial effacement of the ventral thecal sac with resultant mild to moderate spinal stenosis. Severe right with moderate left facet hypertrophy. Resultant moderate bilateral C6 foraminal narrowing.  C6-C7: Diffuse disc bulge with bilateral uncovertebral hypertrophy. Facet and ligament flavum thickening. Resultant mild-to-moderate spinal stenosis. Moderate bilateral C7 foraminal narrowing, slightly worse on the  right.  C7-T1: Mild disc bulge. Moderate facet hypertrophy. No spinal stenosis. Foramina remain patent.  Visualized upper thoracic spine demonstrates mild multilevel disc bulging without significant stenosis.  IMPRESSION: 1. Multilevel cervical spondylosis with resultant severe spinal stenosis at C4-5, with additional mild to moderate diffuse spinal stenosis at C3-4 through C6-7. 2. Multifactorial degenerative changes with resultant multilevel foraminal narrowing as above. Notable findings include moderate bilateral C4 foraminal narrowing, severe bilateral C5 foraminal stenosis, with moderate bilateral C6 and C7 foraminal narrowing. 3. Moderate to advanced multilevel facet degeneration, most notable at C3-4 and C4-5 on the right where there is associated reactive marrow edema. Findings could contribute to underlying neck pain. 4. Patchy cord signal abnormality at the level of C4-5, consistent with chronic myelomalacia, likely due to compressive stenosis.  Electronically Signed By: Jeannine Boga M.D. On: 02/15/2019 21:47  I have personally reviewed the images and agree with the above interpretation.  Assessment and Plan: Debra May is a pleasant 59 y.o. 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 Due to progressive symptoms as well as objective weakness on examination as well as imbalance and walking, I have recommended surgical intervention with C4-7 anterior cervical discectomy and fusion.    Meade Maw MD, [facility] Department of Neurosurgery

## 2019-04-08 NOTE — Op Note (Signed)
Indications: Debra May is a 59 yo female who presented with cervical myelopathy due to extensive cervical spondylosis.  The patient failed conservative management and elected for surgical intervention.  Findings: extensive spondylosis  Preoperative Diagnosis: Cervical myelopathy Postoperative Diagnosis: same   EBL: 50 ml IVF: 1000 ml Drains: none Disposition: Extubated and Stable to PACU Complications: none  No foley catheter was placed.   Preoperative Note:   Risks of surgery discussed include: infection, bleeding, stroke, coma, death, paralysis, CSF leak, nerve/spinal cord injury, numbness, tingling, weakness, complex regional pain syndrome, recurrent stenosis and/or disc herniation, vascular injury, development of instability, neck/back pain, need for further surgery, persistent symptoms, development of deformity, and the risks of anesthesia. The patient understood these risks and agreed to proceed.     Operative Procedure: 1. Anterior Cervical Discectomy and Fusion C4-5 including bilateral foraminotomies and end plate preparation  2. Anterior Cervical Discectomy and Fusion C5-6 including bilateral foraminotomies and end plate preparation  3. Anterior Cervical Discectomy and Fusion C6-7 including bilateral foraminotomies and end plate preparation 4. Anterior Spinal Instrumentation C4 to 7 5. Anterior arthrodesis from C4 to C7 6. Use of the operative microscope 7. Use of intraoperative flouroscopy  PROCEDURE IN DETAIL: After obtaining informed consent, the patient taken to the operating room, placed in supine position, general anesthesia induced.  The patient had a small shoulder roll placed behind their shoulders.  The patient received preop antibiotics and IV Decadron.  The patient had a neck incision outlined, was prepped and draped in usual sterile fashion. The incision was injected with local anesthetic.   An incision was opened, dissection taken down medial to the carotid  artery and jugular vein, lateral to the trachea and esophagus.  The prevertebral fascia identified and a localizing x-ray demonstrated the correct level.  The longus colli were dissected laterally, and self-retaining retractors placed to open the operative field. The microscope was then brought into the field.  With this complete, distractor pins were placed in the vertebral bodies of C4 and C5.  The distractor was placed from C4 to C5. The annulus at C4-5 were opened with a bovie. Curettes and pituitary rongeurs used to remove the majority of disk at each level, then the drill was used to remove the posterior osteophyte and begin the foraminotomies. The nerve hook was used to elevate the posterior longitudinal ligament, which was then removed with Kerrison rongeurs. The microblunt nerve hook could be passed out the foramen bilaterally.   Meticulous hemostasis was obtained. After hemostasis, structural allograft was tapped behind the anterior lip of the vertebral body at C4-5 (7 mm height).     The distractor was then removed, and the C4 caspar pin removed. Bone wax was used for hemostasis. An additional Caspar pin was placed at C7. The distractor was placed from C5-7, and the annuli at C5-6 and C6-7 were opened using a bovie.  Curettes and pituitary rongeurs used to remove the majority of disk, then the drill was used to remove the posterior osteophyte and begin the foraminotomies. The nerve hook was used to elevate the posterior longitudinal ligament, which was then removed with Kerrison rongeurs. The microblunt nerve hook could be passed out the foramen bilaterally at each level.   Meticulous hemostasis was obtained.  Structural allograft was tapped behind the anterior lip of the vertebral body at C5-6 (7 mm height) and C6-7 (7 mm height).  The caspar distractor was removed, and bone wax used for hemostasis at each level. The anterior osteophytes were  removed.    A separate, four segment, three level  plate (51 mm Globus Xtend) was chosen.  Two screws placed in the vertebral bodies of all four segments, respectively making sure the screws were behind the locking mechanism.  Final AP and lateral radiographs were taken.   With everything in good position, the wound was irrigated copiously with bacitracin-containing solution and meticulous hemostasis obtained.  Wound was closed in 2 layers using interrupted inverted 3-0 Vicryl sutures in the platysma and 3-0 monocryl in the dermis.  The wound was dressed with dermabond, the head of bed at 30 degrees, taken to recovery room in stable condition.  No new postop neurological deficits were identified..  All counts were correct at the end of the case.   Monitoring was used throughout without any changes.   I performed the entire procedure with Debra Olp PA as an Environmental consultant.  Meade Maw MD

## 2019-04-08 NOTE — Progress Notes (Signed)
Pt complained that she cannot lift her right arm. Dr. Izora Ribas made aware. New order received.

## 2019-04-08 NOTE — Anesthesia Procedure Notes (Addendum)
Procedure Name: Intubation Date/Time: 04/08/2019 10:17 AM Performed by: Constance Goltz, RN Pre-anesthesia Checklist: Patient identified, Patient being monitored, Timeout performed, Emergency Drugs available and Suction available Patient Re-evaluated:Patient Re-evaluated prior to induction Oxygen Delivery Method: Circle system utilized Preoxygenation: Pre-oxygenation with 100% oxygen Induction Type: IV induction Ventilation: Mask ventilation without difficulty Laryngoscope Size: McGraph and 4 Grade View: Grade I Tube type: Oral Tube size: 7.0 mm Number of attempts: 1 Airway Equipment and Method: Stylet Placement Confirmation: ETT inserted through vocal cords under direct vision,  positive ETCO2 and breath sounds checked- equal and bilateral Secured at: 19 cm Tube secured with: Tape Dental Injury: Teeth and Oropharynx as per pre-operative assessment

## 2019-04-08 NOTE — Anesthesia Preprocedure Evaluation (Addendum)
Anesthesia Evaluation  Patient identified by MRN, date of birth, ID band Patient awake    Reviewed: Allergy & Precautions, NPO status , Patient's Chart, lab work & pertinent test results  History of Anesthesia Complications Negative for: history of anesthetic complications  Airway Mallampati: II  TM Distance: >3 FB Neck ROM: Full    Dental  (+) Partial Upper   Pulmonary neg sleep apnea, neg COPD, Current Smoker (vapes) and Patient abstained from smoking., former smoker,    breath sounds clear to auscultation- rhonchi (-) wheezing      Cardiovascular Exercise Tolerance: Good (-) hypertension(-) CAD, (-) Past MI, (-) Cardiac Stents and (-) CABG  Rhythm:Regular Rate:Normal - Systolic murmurs and - Diastolic murmurs    Neuro/Psych neg Seizures negative neurological ROS  negative psych ROS   GI/Hepatic negative GI ROS, Neg liver ROS,   Endo/Other  negative endocrine ROSneg diabetes  Renal/GU negative Renal ROS     Musculoskeletal negative musculoskeletal ROS (+)   Abdominal (+) + obese,   Peds  Hematology negative hematology ROS (+)   Anesthesia Other Findings Past Medical History: 2011: Cancer ([place])     Comment:  PAROTID   Reproductive/Obstetrics                             Anesthesia Physical Anesthesia Plan  ASA: II  Anesthesia Plan: General   Post-op Pain Management:    Induction: Intravenous  PONV Risk Score and Plan: 2 and Ondansetron, Dexamethasone and Midazolam  Airway Management Planned: Oral ETT  Additional Equipment:   Intra-op Plan:   Post-operative Plan: Extubation in OR  Informed Consent: I have reviewed the patients History and Physical, chart, labs and discussed the procedure including the risks, benefits and alternatives for the proposed anesthesia with the patient or authorized representative who has indicated his/her understanding and acceptance.      Dental advisory given  Plan Discussed with: CRNA and Anesthesiologist  Anesthesia Plan Comments: (Discussed possible arterial line placement)       Anesthesia Quick Evaluation

## 2019-04-08 NOTE — Anesthesia Procedure Notes (Signed)
Arterial Line Insertion Start/End3/10/2019 10:30 AM, 04/08/2019 10:36 AM Performed by: Emmie Niemann, MD, anesthesiologist  Preanesthetic checklist: patient identified, IV checked, site marked, risks and benefits discussed, surgical consent, monitors and equipment checked, pre-op evaluation, timeout performed and anesthesia consent radial was placed Catheter size: 20 G Hand hygiene performed  and Seldinger technique used Allen's test indicative of satisfactory collateral circulation Attempts: 2 Procedure performed without using ultrasound guided technique. Following insertion, dressing applied and Biopatch. Post procedure assessment: normal  Patient tolerated the procedure well with no immediate complications.

## 2019-04-08 NOTE — Transfer of Care (Signed)
Immediate Anesthesia Transfer of Care Note  Patient: Debra May  Procedure(s) Performed: ANTERIOR CERVICAL DECOMPRESSION/DISCECTOMY FUSION 3 LEVELS C4-7 (N/A )  Patient Location: PACU    Anesthesia Type:General  Level of Consciousness: awake, alert  and oriented  Airway & Oxygen Therapy: Patient Spontanous Breathing and Patient connected to face mask oxygen  Post-op Assessment: Report given to RN and Post -op Vital signs reviewed and stable  Post vital signs: Reviewed and stable  Last Vitals:  Vitals Value Taken Time  BP 119/61 04/08/19 1308  Temp 36.1 C 04/08/19 1308  Pulse 85 04/08/19 1308  Resp 12 04/08/19 1308  SpO2 100 % 04/08/19 1308    Last Pain:  Vitals:   04/08/19 1308  TempSrc: Tympanic  PainSc:          Complications: No apparent anesthesia complications

## 2019-04-08 NOTE — Anesthesia Postprocedure Evaluation (Signed)
Anesthesia Post Note  Patient: Debra May  Procedure(s) Performed: ANTERIOR CERVICAL DECOMPRESSION/DISCECTOMY FUSION 3 LEVELS C4-7 (N/A )  Patient location during evaluation: PACU Anesthesia Type: General Level of consciousness: awake and alert and oriented Pain management: pain level controlled Vital Signs Assessment: post-procedure vital signs reviewed and stable Respiratory status: spontaneous breathing, nonlabored ventilation and respiratory function stable Cardiovascular status: blood pressure returned to baseline and stable Postop Assessment: no signs of nausea or vomiting Anesthetic complications: no     Last Vitals:  Vitals:   04/08/19 1415 04/08/19 1420  BP:  116/73  Pulse: 66 60  Resp: 15 11  Temp: (!) 36 C   SpO2: 92% 93%    Last Pain:  Vitals:   04/08/19 1415  TempSrc:   PainSc: 2                  Dois Juarbe

## 2019-04-08 NOTE — Progress Notes (Signed)
D: Pt alert and oriented x 4. Pt reports experiencing any pain at this time and has received PRN pain meds.  A: Scheduled medications administered to pt, per MD orders. Support and encouragement provided. Frequent verbal contact made.   R: No adverse drug reactions noted. Pt complaint with medications and treatment plan. Pt interacts well with staff on the unit. Pt is stable at this time, Will continue to monitor and provide care for as ordered.

## 2019-04-09 ENCOUNTER — Inpatient Hospital Stay: Payer: BC Managed Care – PPO

## 2019-04-09 MED ORDER — OXYCODONE HCL 5 MG PO TABS: 5.0000 mg | ORAL_TABLET | ORAL | 0 refills | Status: AC | PRN

## 2019-04-09 MED ORDER — METHYLPREDNISOLONE 4 MG PO TBPK: ORAL_TABLET | ORAL | 0 refills | Status: AC

## 2019-04-09 MED ORDER — METHOCARBAMOL 500 MG PO TABS: 500.0000 mg | ORAL_TABLET | Freq: Four times a day (QID) | ORAL | 0 refills | Status: AC | PRN

## 2019-04-09 NOTE — Progress Notes (Signed)
D: Pt alert and oriented x4. Pt reports experiencing a 6/10 pain at surgical site, PRN meds given this morning.   A: Pt and caregiver spouse received discharge and medication education/information. Pt belongings were gathered and taken with pt upon discharge to include prescription for oxycodone.   R: Pt and caregiver verbalized understanding of discharge and medication education/information.  Pt escorted to medical mall front lobby via wheelchair by staff where spouse picked pt up.

## 2019-04-09 NOTE — Evaluation (Signed)
Physical Therapy Evaluation Patient Details Name: Debra May MRN: [id number] DOB: 1961/01/04 Today's Date: 04/09/2019   History of Present Illness  Pt is a 59 yo female with a h/o parotid CA and cervical spondylosis.  Pt diagnosed with cervical myelopathy and is s/p C4-5, C5-6, and C6-7 ACDF and foraminotomies.    Clinical Impression  Pt pleasant and motivated to participate during the session.  Pt did not require physical assistance with bed mob, transfers, gait, or with stairs but did require cues and strategy education to safely scan environment while maintaining cervical precautions.  Pt presented with deficits in RUE strength most notably with elbow flex 3+/5 and with shoulder flex/abd of <3/5.  Pt will benefit from OPPT services upon discharge to safely address deficits listed in patient problem list for eventual return to PLOF.      Follow Up Recommendations Outpatient PT    Equipment Recommendations  None recommended by PT    Recommendations for Other Services       Precautions / Restrictions Precautions Precautions: Cervical Precaution Comments: No Bending, Arching, or Twisting Required Braces or Orthoses: Cervical Brace Cervical Brace: At all times Restrictions Weight Bearing Restrictions: No Other Position/Activity Restrictions: Don cervical brace when upright      Mobility  Bed Mobility Overal bed mobility: Needs Assistance Bed Mobility: Supine to Sit;Sit to Supine     Supine to sit: Supervision Sit to supine: Supervision   General bed mobility comments: Min verbal cues for sequencing  Transfers Overall transfer level: Needs assistance Equipment used: None Transfers: Sit to/from Stand Sit to Stand: Supervision         General transfer comment: Good control and stability  Ambulation/Gait Ambulation/Gait assistance: Supervision Gait Distance (Feet): 200 Feet  X 1, 150 Feet x 1 Assistive device: None Gait Pattern/deviations: Step-through  pattern;Decreased step length - right;Decreased step length - left Gait velocity: decreased   General Gait Details: Good stability with decreased cadence and cues for proper scanning of environment without breaking cervical precautions  Stairs Stairs: Yes Stairs assistance: Supervision Stair Management: No rails Number of Stairs: 4 General stair comments: Min verbal and visual cues for sequencing for safety and to avoid breaking cervical precautions  Wheelchair Mobility    Modified Rankin (Stroke Patients Only)       Balance Overall balance assessment: Independent                                           Pertinent Vitals/Pain Pain Assessment: 0-10 Pain Score: 3  Pain Location: neck Pain Descriptors / Indicators: Sore Pain Intervention(s): Premedicated before session;Monitored during session    Home Living Family/patient expects to be discharged to:: Private residence Living Arrangements: Spouse/significant other Available Help at Discharge: Family;Available 24 hours/day Type of Home: House Home Access: Stairs to enter Entrance Stairs-Rails: None Entrance Stairs-Number of Steps: 2 Home Layout: One level        Prior Function Level of Independence: Independent         Comments: Pt Ind with amb without an AD community distances, no fall history, Ind with ADLs, works FT     Journalist, newspaper        Extremity/Trunk Assessment   Upper Extremity Assessment Upper Extremity Assessment: RUE deficits/detail;Defer to OT evaluation RUE Deficits / Details: Elbow flex 3+/5, ext 4+/5; Shoulder flex and abd <3/5, ext 4/5    Lower Extremity Assessment  Lower Extremity Assessment: Overall WFL for tasks assessed       Communication   Communication: No difficulties  Cognition Arousal/Alertness: Awake/alert Behavior During Therapy: WFL for tasks assessed/performed Overall Cognitive Status: Within Functional Limits for tasks assessed                                         General Comments General comments (skin integrity, edema, etc.): SLS > 10 sec on each LE; good stability with feet together with eyes open and eyes closed with (-) Romberg test    Exercises Other Exercises Other Exercises: Cervical precaution education and review provided   Assessment/Plan    PT Assessment Patient needs continued PT services  PT Problem List Decreased strength;Decreased knowledge of precautions;Pain       PT Treatment Interventions Gait training;Stair training;Functional mobility training;Therapeutic activities;Therapeutic exercise;Balance training;Patient/family education    PT Goals (Current goals can be found in the Care Plan section)  Acute Rehab PT Goals Patient Stated Goal: Increased RUE strength PT Goal Formulation: With patient Time For Goal Achievement: 04/22/19 Potential to Achieve Goals: Good    Frequency BID   Barriers to discharge        Co-evaluation               AM-PAC PT "6 Clicks" Mobility  Outcome Measure Help needed turning from your back to your side while in a flat bed without using bedrails?: A Little Help needed moving from lying on your back to sitting on the side of a flat bed without using bedrails?: A Little Help needed moving to and from a bed to a chair (including a wheelchair)?: A Little Help needed standing up from a chair using your arms (e.g., wheelchair or bedside chair)?: A Little Help needed to walk in hospital room?: A Little Help needed climbing 3-5 steps with a railing? : A Little 6 Click Score: 18    End of Session Equipment Utilized During Treatment: Gait belt Activity Tolerance: Patient tolerated treatment well Patient left: in bed;with call bell/phone within reach;with bed alarm set;with SCD's reapplied;with family/visitor present Nurse Communication: Mobility status PT Visit Diagnosis: Muscle weakness (generalized) (M62.81);Pain Pain - part of body: (Neck)     Time: [id number] PT Time Calculation (min) (ACUTE ONLY): 29 min   Charges:   PT Evaluation $PT Eval Moderate Complexity: 1 Mod PT Treatments $Gait Training: 8-22 mins        D. Royetta Asal PT, DPT 04/09/19, 12:03 PM

## 2019-04-09 NOTE — Plan of Care (Signed)

## 2019-04-09 NOTE — Discharge Summary (Signed)
  Procedure: C4-7 ACDF Procedure date: 04/08/2019 Diagnosis: Cervical radiculopathy   History: Debra May is s/p C4-7 ACDF  POD1: Recovering well. Complains of posterior neck pain 4/10. Feels it is adequately controlled with current pain regimen. She is able tolerate soft foods and liquids. Experiencing new right upper extremity deltoid weakness that wasn't present prior to surgery. Also continues to experience hypersensitivity through the right upper extremity that was present prior to surgery. Voiding and ambulating without issue.    Physical Exam: Vitals:   04/09/19 0401 04/09/19 0801  BP: 134/73 (!) 143/82  Pulse: 70 73  Resp: 13 20  Temp: 97.6 F (36.4 C)   SpO2: 95% 96%    General: Alert and oriented, laying in hospital bed. Cervical collar present.  Strength:5/5 throughout upper and lower extremities except right deltoid. 2-3/5 (able to raise arm but limited to approximately 45 degrees)  Sensation: increased sensitivity to light tough right upper extremity.  Skin: Glue intact at incision site. No significant edema noted.   Data:  Recent Labs  Lab 04/06/19 0849  NA 141  K 3.8  CL 108  CO2 25  BUN 13  CREATININE 0.61  GLUCOSE 103*  CALCIUM 9.3   No results for input(s): AST, ALT, ALKPHOS in the last 168 hours.  Invalid input(s): TBILI   Recent Labs  Lab 04/06/19 0849  WBC 4.7  HGB 13.1  HCT 40.5  PLT 272   Recent Labs  Lab 04/06/19 0849  APTT 29  INR 0.9         Other tests/results: EXAM: CERVICAL SPINE - 2-3 VIEW 04/09/2019  COMPARISON:  04/08/2019  FINDINGS: Anterior sideplate and screw device extends for C4 through C7. Interbody bone graft material noted at C4-5, C5-6 and C6-7 disc spaces. Alignment is anatomic. No immediate complications identified. Bilateral facet degenerative change is identified.  IMPRESSION: Status post ACDF of C4 through C7.  Assessment/Plan:  Debra May is POD1 s/p C4-7 ACDF. 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 Able to tolerate soft food an liquid. Will continue post op pain control with tylenol, robaxin, and oxycodone as needed. Medrol dosepack provided to help swallowing and potential C5 palsy. Discussed would care and activity restrictions. She is scheduled to follow up in approximately 2 weeks in clinic to monitor progress.    Marin Olp PA-C Department of Neurosurgery

## 2019-04-09 NOTE — TOC Transition Note (Signed)
Transition of Care [facility]) - CM/SW Discharge Note   Patient Details  Name: Debra May MRN: [id number] Date of Birth: 07/19/1960  [facility] CM/SW Contact:  Elease Hashimoto, LCSW Phone Number: 04/09/2019, 11:54 AM   Clinical Narrative:   Met with pt and husband who is at the bedside. He will be home with her at discharge. Pt wants to go to [facility] for her therapies due to close to her home and she has been there before. She is having much pain and is trying to lay still. She was independent prior to admission but has declined due to back and neck issues. Pt has no equipment needs. Discharge order written for today. Have asked Amanda-PA for order for OPPT.       Barriers to Discharge: No Barriers Identified   Patient Goals and CMS Choice Patient states their goals for this hospitalization and ongoing recovery are:: I hope my pain gets better      Discharge Placement                       Discharge Plan and Services In-house Referral: Clinical Social Work   Post Acute Care Choice: NA                               Social Determinants of Health (SDOH) Interventions     Readmission Risk Interventions No flowsheet data found.

## 2019-04-09 NOTE — Discharge Instructions (Signed)
Your surgeon has performed an operation on your cervical spine (neck) to relieve pressure on the spinal cord and/or nerves. This involved making an incision in the front of your neck and removing one or more of the discs that support your spine. Next, a small piece of bone, a titanium plate, and screws were used to fuse two or more of the vertebrae (bones) together.  The following are instructions to help in your recovery once you have been discharged from the hospital. Even if you feel well, it is important that you follow these activity guidelines. If you do not let your neck heal properly from the surgery, you can increase the chance of return of your symptoms and other complications.  * Do not take anti-inflammatory medications for 3 months after surgery (naproxen [Aleve], ibuprofen [Advil, Motrin], etc.). These medications can prevent your bones from healing properly.  Activity    No bending, lifting, or twisting ("BLT"). Avoid lifting objects heavier than 10 pounds (gallon milk jug).  Where possible, avoid household activities that involve lifting, bending, reaching, pushing, or pulling such as laundry, vacuuming, grocery shopping, and childcare. Try to arrange for help from friends and family for these activities while your back heals.  Increase physical activity slowly as tolerated.  Taking short walks is encouraged, but avoid strenuous exercise. Do not jog, run, bicycle, lift weights, or participate in any other exercises unless specifically allowed by your doctor.  Talk to your doctor before resuming sexual activity.  You should not drive until cleared by your doctor.  Until released by your doctor, you should not return to work or school.  You should rest at home and let your body heal.   You may shower three days after your surgery.  After showering, lightly dab your incision dry. Do not take a tub bath or go swimming until approved by your doctor at your follow-up appointment.  If  your doctor ordered a cervical collar (neck brace) for you, you should wear it whenever you are out of bed. You may remove it when lying down or sleeping, but you should wear it at all other times. Not all neck surgeries require a cervical collar.  If you smoke, we strongly recommend that you quit.  Smoking has been proven to interfere with normal bone healing and will dramatically reduce the success rate of your surgery. Please contact [facility] (800-QUIT-NOW) and use the resources at www.[facility].com for assistance in stopping smoking.  Surgical Incision   If you have a dressing on your incision, you may remove it two days after your surgery. Keep your incision area clean and dry.  If you have staples or stitches on your incision, you should have a follow up scheduled for removal. If you do not have staples or stitches, you will have steri-strips (small pieces of surgical tape) or Dermabond glue. The steri-strips/glue should begin to peel away within about a week (it is fine if the steri-strips fall off before then). If the strips are still in place one week after your surgery, you may gently remove them.  Diet           You may return to your usual diet. However, you may experience discomfort when swallowing in the first month after your surgery. This is normal. You may find that softer foods are more comfortable for you to swallow. Be sure to stay hydrated.  When to Contact us  You may experience pain in your neck and/or pain between your shoulder blades. This  is normal and should improve in the next few weeks with the help of pain medication, muscle relaxers, and rest. Some patients report that a warm compress on the back of the neck or between the shoulder blades helps. ° °However, should you experience any of the following, contact us immediately: °• New numbness or weakness °• Pain that is progressively getting worse, and is not relieved by your pain medication, muscle relaxers, rest, and  warm compresses °• Bleeding, redness, swelling, pain, or drainage from surgical incision °• Chills or flu-like symptoms °• Fever greater than 101.0 F (38.3 C) °• Inability to eat, drink fluids, or take medications °• Problems with bowel or bladder functions °• Difficulty breathing or shortness of breath °• Warmth, tenderness, or swelling in your calf °Contact Information °• During office hours (Monday-Friday 9 am to 5 pm), please call your physician at [phone] and ask for Kendelyn Jean °• After hours and weekends, please call [phone] and an answering service will put you in touch with either Dr. Cook or Dr. Yarbrough.  °• For a life-threatening emergency, call 911 ° ° °

## 2019-04-09 NOTE — Progress Notes (Signed)
   04/09/19 1300  Clinical Encounter Type  Visited With Patient and family together  Visit Type Initial  Referral From Chaplain  Consult/Referral To Chaplain  Chaplain visited with patient and as soon as she entered the room, patient saw AD in her hand and said I don't need it. Patient thought that there was another one. Patient already has the blue one and knows that it has to be notarized. Patient told Chaplain that she was leaving within the next 30 minutes. Chaplain asked if there was anything else she could do, patient said no, and Chaplain left.

## 2019-12-21 ENCOUNTER — Other Ambulatory Visit: Payer: Self-pay | Admitting: Physical Medicine and Rehabilitation

## 2019-12-21 DIAGNOSIS — M5030 Other cervical disc degeneration, unspecified cervical region: Secondary | ICD-10-CM

## 2019-12-21 DIAGNOSIS — M5412 Radiculopathy, cervical region: Secondary | ICD-10-CM

## 2019-12-31 ENCOUNTER — Ambulatory Visit
Admission: RE | Admit: 2019-12-31 | Discharge: 2019-12-31 | Disposition: A | Discharge status: Home / Self Care | Payer: BC Managed Care – PPO | Source: Ambulatory Visit | Attending: Physical Medicine and Rehabilitation | Admitting: Physical Medicine and Rehabilitation

## 2019-12-31 ENCOUNTER — Other Ambulatory Visit: Payer: Self-pay

## 2019-12-31 DIAGNOSIS — M5412 Radiculopathy, cervical region: Secondary | ICD-10-CM | POA: Diagnosis present

## 2019-12-31 DIAGNOSIS — M5030 Other cervical disc degeneration, unspecified cervical region: Secondary | ICD-10-CM | POA: Diagnosis present

## 2020-09-02 IMAGING — CR DG CERVICAL SPINE 2 OR 3 VIEWS
2 series · 2 of 2 positions shown · non-contrast
Comparison: 04/08/2019

CLINICAL DATA: Status post ACDF

EXAM:
CERVICAL SPINE - 2-3 VIEW

[c-spine lat]
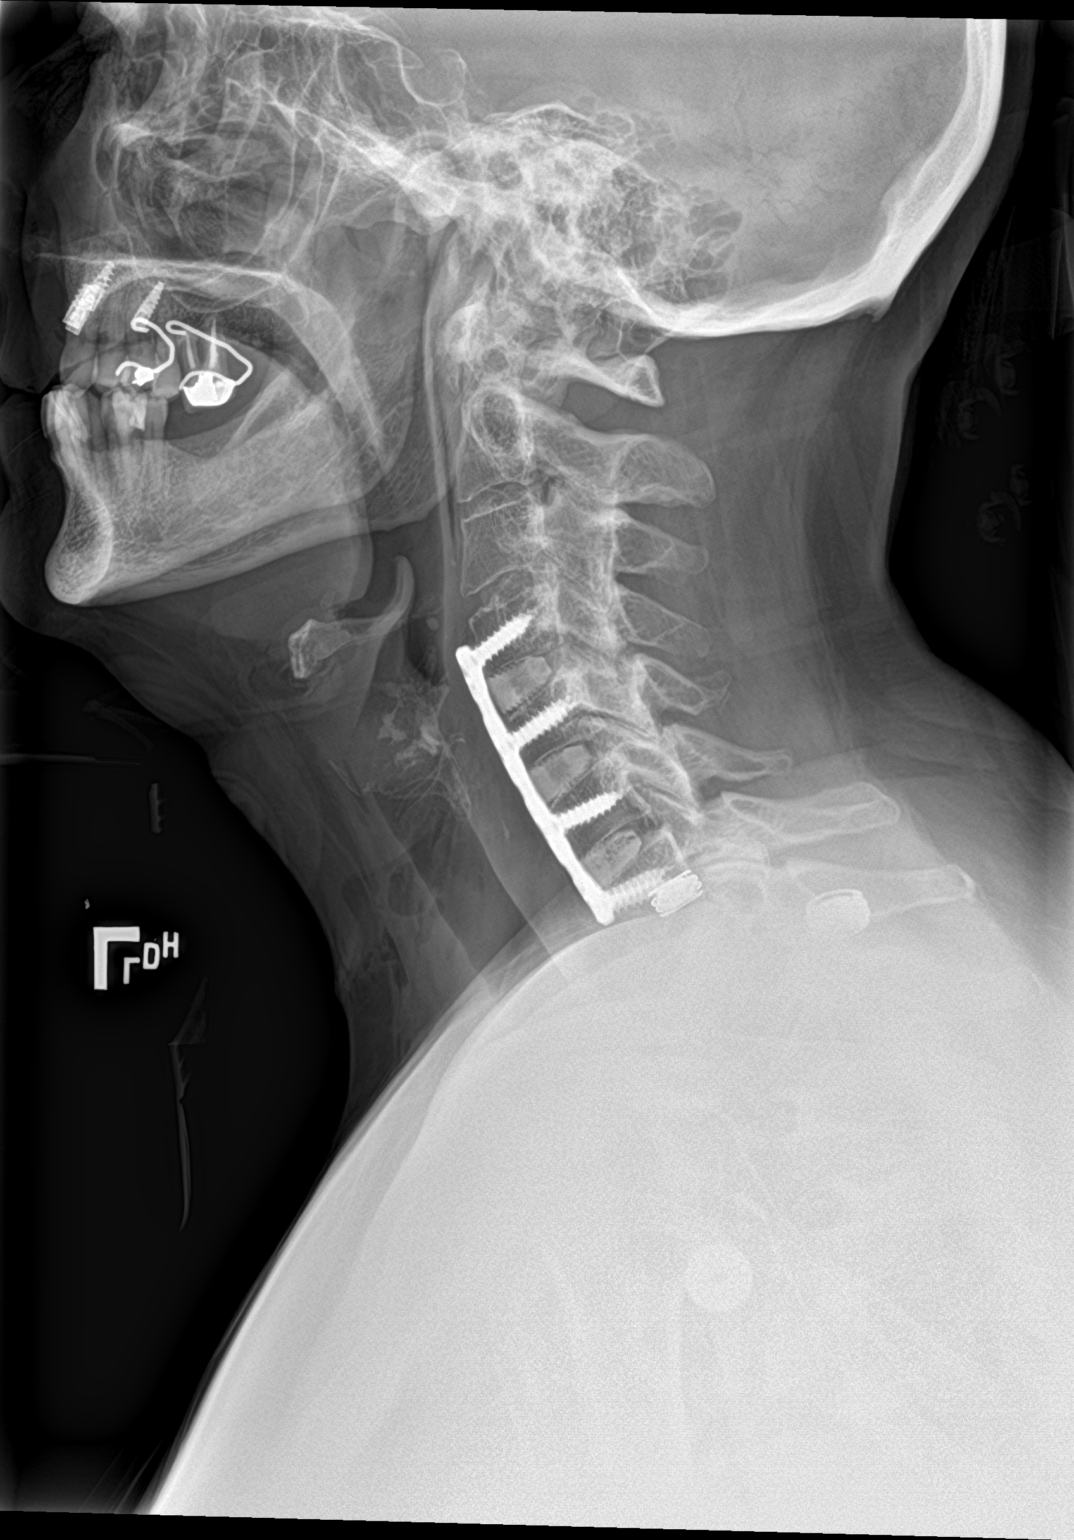

[c-spine ap]
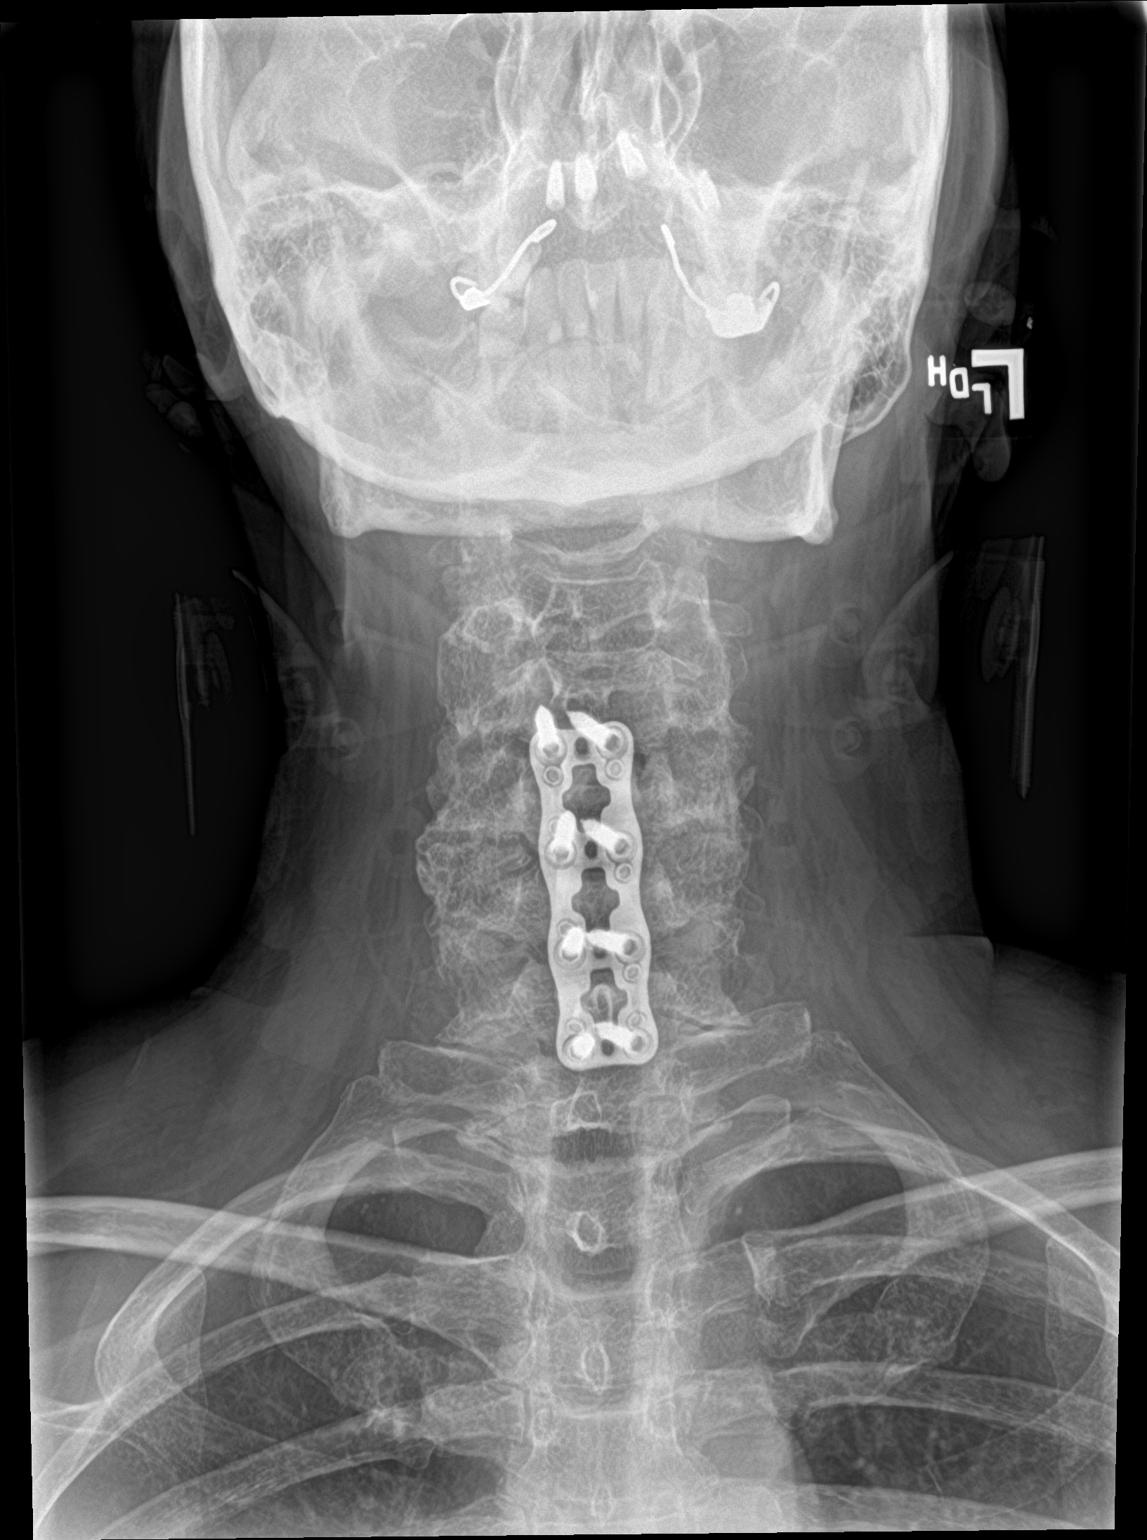

[2 of 2 positions shown; findings below may reference images not displayed]

FINDINGS: Anterior sideplate and screw device extends for C4 through C7.
Interbody bone graft material noted at C4-5, C5-6 and C6-7 disc
spaces. Alignment is anatomic. No immediate complications
identified. Bilateral facet degenerative change is identified.
IMPRESSION: Status post ACDF of C4 through C7.
